# Patient Record
Sex: Male | Born: 1995 | Race: White | Hispanic: No | Marital: Married | Smoking: Never smoker
Health system: Southern US, Community
[De-identification: ages and names within clinical notes are randomized; demographics above are authoritative.]

---
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
## 2015-07-03 ENCOUNTER — Encounter (HOSPITAL_COMMUNITY): Payer: Self-pay | Admitting: Emergency Medicine

## 2015-07-03 ENCOUNTER — Emergency Department (HOSPITAL_COMMUNITY)
Admission: EM | Admit: 2015-07-03 | Discharge: 2015-07-04 | Disposition: A | Discharge status: Other Acute Inpatient Hospital | Payer: Medicaid Other | Attending: Emergency Medicine | Admitting: Emergency Medicine

## 2015-07-03 DIAGNOSIS — G479 Sleep disorder, unspecified: Secondary | ICD-10-CM | POA: Insufficient documentation

## 2015-07-03 DIAGNOSIS — F1210 Cannabis abuse, uncomplicated: Secondary | ICD-10-CM | POA: Insufficient documentation

## 2015-07-03 DIAGNOSIS — F329 Major depressive disorder, single episode, unspecified: Secondary | ICD-10-CM | POA: Insufficient documentation

## 2015-07-03 DIAGNOSIS — F99 Mental disorder, not otherwise specified: Secondary | ICD-10-CM

## 2015-07-03 DIAGNOSIS — R05 Cough: Secondary | ICD-10-CM | POA: Insufficient documentation

## 2015-07-03 LAB — COMPREHENSIVE METABOLIC PANEL
ALBUMIN: 4.3 g/dL (ref 3.5–5.0)
ALT: 13 U/L — ABNORMAL LOW (ref 17–63)
AST: 18 U/L (ref 15–41)
Alkaline Phosphatase: 103 U/L (ref 38–126)
Anion gap: 10 (ref 5–15)
BILIRUBIN TOTAL: 0.9 mg/dL (ref 0.3–1.2)
BUN: 9 mg/dL (ref 6–20)
CALCIUM: 9.9 mg/dL (ref 8.9–10.3)
CO2: 25 mmol/L (ref 22–32)
CREATININE: 0.93 mg/dL (ref 0.61–1.24)
Chloride: 106 mmol/L (ref 101–111)
GFR calc Af Amer: >60 mL/min (ref >60)
GLUCOSE: 109 mg/dL — AB (ref 65–99)
Potassium: 3.5 mmol/L (ref 3.5–5.1)
Sodium: 141 mmol/L (ref 135–145)
TOTAL PROTEIN: 7.7 g/dL (ref 6.5–8.1)

## 2015-07-03 LAB — CBC
HEMATOCRIT: 45.5 % (ref 39.0–52.0)
Hemoglobin: 15.9 g/dL (ref 13.0–17.0)
MCH: 29.2 pg (ref 26.0–34.0)
MCHC: 34.9 g/dL (ref 30.0–36.0)
MCV: 83.5 fL (ref 78.0–100.0)
PLATELETS: 246 10*3/uL (ref 150–400)
RBC: 5.45 MIL/uL (ref 4.22–5.81)
RDW: 12.2 % (ref 11.5–15.5)
WBC: 7.3 10*3/uL (ref 4.0–10.5)

## 2015-07-03 LAB — ACETAMINOPHEN LEVEL: Acetaminophen (Tylenol), Serum: <10 ug/mL — ABNORMAL LOW (ref 10–30)

## 2015-07-03 LAB — RAPID URINE DRUG SCREEN, HOSP PERFORMED
Amphetamines: NOT DETECTED
BARBITURATES: NOT DETECTED
BENZODIAZEPINES: NOT DETECTED
Cocaine: NOT DETECTED
Opiates: NOT DETECTED
Tetrahydrocannabinol: POSITIVE — AB

## 2015-07-03 LAB — SALICYLATE LEVEL: Salicylate Lvl: <4 mg/dL (ref 2.8–30.0)

## 2015-07-03 LAB — ETHANOL

## 2015-07-03 NOTE — ED Notes (Signed)
No answer in waiting room 

## 2015-07-03 NOTE — ED Notes (Signed)
Staffing called for sitter.   

## 2015-07-03 NOTE — ED Notes (Signed)
Pt requested sandwich after telling sitter that he had not eaten for over a day.  He is now verbal and communicating freely.  This EMT provided [place] sandwich, applesauce, milk and ice water.

## 2015-07-03 NOTE — ED Notes (Signed)
Pt alert and talking animatedly with sitter. Significant improvement in mood, speech, and behavior from arrival.

## 2015-07-03 NOTE — ED Notes (Signed)
Family member states pts friend committed sucide  3 days ago and today pt told aunt that he was having thoughts about harming himself. Pt is crying in triage and will not answer any questions other that shaking his head yes or no.

## 2015-07-03 NOTE — ED Provider Notes (Signed)
CSN: [id number]     Arrival date & time 07/03/15  1943 History   First MD Initiated Contact with Patient 07/03/15 2043     Chief Complaint  Patient presents with  . Suicidal     (Consider location/radiation/quality/duration/timing/severity/associated sxs/prior Treatment) HPI Comments: Normally healthy 20 year old male who presents to the emergency department with thoughts of not wanting to be here.  He states it is a wish.  Since his girlfriend committed suicide 3 days ago.  He's feeling incredibly sad, crying.  He states that he used to smoke.  Rare marijuana with his girlfriend, but since she died.  He has not used any drugs or alcohol.  He has no specific plan for harming himself.  The history is provided by the patient.    History reviewed. No pertinent past medical history. History reviewed. No pertinent past surgical history. History reviewed. No pertinent family history. Social History  Substance Use Topics  . Smoking status: Never Smoker   . Smokeless tobacco: None  . Alcohol Use: No    Review of Systems  Constitutional: Negative for fever.  Respiratory: Positive for cough.   Neurological: Negative for headaches.  Psychiatric/Behavioral: Positive for suicidal ideas and sleep disturbance.  All other systems reviewed and are negative.     Allergies  Review of patient's allergies indicates no known allergies.  Home Medications   Prior to Admission medications   Not on File   BP 140/98 mmHg  Pulse 88  Temp(Src) 98.8 F (37.1 C) (Oral)  Resp 16  SpO2 100% Physical Exam  Constitutional: He appears well-developed and well-nourished.  HENT:  Head: Normocephalic.  Eyes: Pupils are equal, round, and reactive to light.  Neck: Normal range of motion.  Cardiovascular: Normal rate.   Pulmonary/Chest: Effort normal.  Musculoskeletal: Normal range of motion.  Neurological: He is alert.  Skin: Skin is warm.  Psychiatric: Judgment normal. His speech is delayed.  Cognition and memory are normal. He exhibits a depressed mood. He expresses suicidal ideation. He expresses no suicidal plans and no homicidal plans.  Patient does not make contact.  He appears very sad and tearful He is inattentive.  Nursing note and vitals reviewed.   ED Course  Procedures (including critical care time) Labs Review Labs Reviewed  COMPREHENSIVE METABOLIC PANEL - Abnormal; Notable for the following:    Glucose, Bld 109 (*)    ALT 13 (*)    All other components within normal limits  ACETAMINOPHEN LEVEL - Abnormal; Notable for the following:    Acetaminophen (Tylenol), Serum <10 (*)    All other components within normal limits  URINE RAPID DRUG SCREEN, HOSP PERFORMED - Abnormal; Notable for the following:    Tetrahydrocannabinol POSITIVE (*)    All other components within normal limits  ETHANOL  SALICYLATE LEVEL  CBC    Imaging Review No results found. I have personally reviewed and evaluated these images and lab results as part of my medical decision-making.   EKG Interpretation None     Patient is medically cleared for psychiatric evaluation This patient has been evaluated by TTS they do not feel that he met criteria for admission and he was much more animated at the time of their discussion, I do not feel comfortable discharging the patient home at this time without having been seen by the psychiatrist.  He will be held over until morning to see psychiatry.  He will then determine better disposition for this patient MDM   Final diagnoses:  None  Junius Creamer, NP 07/03/15 [place], NP 07/04/15 0302  Davonna Belling, MD 07/05/15 [phone]

## 2015-07-04 ENCOUNTER — Observation Stay (HOSPITAL_COMMUNITY)
Admission: AD | Admit: 2015-07-04 | Discharge: 2015-07-05 | Disposition: A | Discharge status: Home / Self Care | Payer: Federal, State, Local not specified - Other | Source: Intra-hospital | Attending: Psychiatry | Admitting: Psychiatry

## 2015-07-04 ENCOUNTER — Encounter (HOSPITAL_COMMUNITY): Payer: Self-pay | Admitting: *Deleted

## 2015-07-04 DIAGNOSIS — R45851 Suicidal ideations: Secondary | ICD-10-CM | POA: Insufficient documentation

## 2015-07-04 DIAGNOSIS — F4321 Adjustment disorder with depressed mood: Secondary | ICD-10-CM | POA: Diagnosis not present

## 2015-07-04 MED ORDER — ACETAMINOPHEN 325 MG PO TABS: 650.0000 mg | ORAL_TABLET | Freq: Four times a day (QID) | ORAL | Status: DC | PRN

## 2015-07-04 MED ORDER — MAGNESIUM HYDROXIDE 400 MG/5ML PO SUSP: 30.0000 mL | Freq: Every day | ORAL | Status: DC | PRN

## 2015-07-04 MED ORDER — HYDROXYZINE HCL 25 MG PO TABS: 25.0000 mg | ORAL_TABLET | Freq: Three times a day (TID) | ORAL | Status: DC | PRN

## 2015-07-04 MED ORDER — DOXEPIN HCL 10 MG PO CAPS
10.0000 mg | ORAL_CAPSULE | Freq: Every day | ORAL | Status: DC
  Administered 2015-07-04: 10 mg via ORAL
  Filled 2015-07-04 (×4): qty 1

## 2015-07-04 MED ORDER — ALUM & MAG HYDROXIDE-SIMETH 200-200-20 MG/5ML PO SUSP: 30.0000 mL | ORAL | Status: DC | PRN

## 2015-07-04 MED ORDER — TRAZODONE HCL 50 MG PO TABS
50.0000 mg | ORAL_TABLET | Freq: Every evening | ORAL | Status: DC | PRN
Start: 2015-07-04 — End: 2015-07-04

## 2015-07-04 NOTE — Plan of Care (Signed)
[facility] Observation Crisis Plan  Reason for Crisis Plan:  Crisis Stabilization   Plan of Care:  Referral for Telepsychiatry/Psychiatric Consult  Family Support:      Current Living Environment:  Living Arrangements: Parent  Insurance:   Hospital Account    Name Acct ID Class Status Primary Coverage   Lindalou HoseFranklin, Jaxsin N [id number] BEHAVIORAL HEALTH OBSERVATION [facility] [place] [address]        Guarantor Account (for Hospital Account [id number])    Name Relation to Pt Service Area Active? Acct Type   Lindalou HoseFranklin, Hesham N Self [facility] Yes [facility]   Address Phone       [address] [place], [place] [address] [phone](O)          Coverage Information (for Hospital Account [id number])    F/O Payor/Plan Precert #   [facility] [address]    Subscriber Subscriber #   Lindalou HoseFranklin, Darrio N [id number]   Address Phone   [address] [place], [place] [address] [phone]      Legal Guardian:     Primary Care Provider:  Leo GrosserPICKARD,WARREN TOM, MD  Current Outpatient Providers:  unknown  Psychiatrist:     Counselor/Therapist:     Compliant with Medications:  Yes  Additional Information:   Loren RacerMaggio, Elisabel Hanover J 1/4/201711:57 AM

## 2015-07-04 NOTE — ED Notes (Signed)
Pt's mother Tommy Robinson, [phone]

## 2015-07-04 NOTE — Progress Notes (Signed)
Patient ID: Tommy Robinson, male   DOB: [birth year], 20 y.o.   MRN: [id number] Patient admitted to obs. After telling aunt he wished he was with his dead GF.  Patient stated his girlfriend of two months hung herself on New Years Day. Patient states he is not suicidal but is sad and depressed and withdrawn.  Patient states he uses marijuana regularly but rarely drinks alcohol.  Denies HI and AVH.  Denies withdrawal symptoms.  Affect sad, depressed and guarded. Patient oriented to unit.

## 2015-07-04 NOTE — ED Notes (Signed)
Visitor at bedside.

## 2015-07-04 NOTE — ED Notes (Addendum)
Pt's father, Loel LoftyJoe Vink [phone]  Pt's grandparents Molly MaduroRobert and Vassie MomentVeronica Granados [phone] - if pt needs a ride  Given permission by pt to update father on pt's disposition

## 2015-07-04 NOTE — Progress Notes (Signed)
[facility] bed [facility]. Rosey BathKelly Gianny Sabino, RN

## 2015-07-04 NOTE — Progress Notes (Signed)
[facility] INPATIENT:  Family/Significant Other Suicide Prevention Education  Suicide Prevention Education:  Patient Refusal for Family/Significant Other Suicide Prevention Education: The patient Lindalou HoseJoshua N Escalera has refused to provide written consent for family/significant other to be provided Family/Significant Other Suicide Prevention Education during admission and/or prior to discharge.  Physician notified.  Loren RacerMaggio, Aviel Davalos J 07/04/2015, 11:57 AM

## 2015-07-04 NOTE — BH Assessment (Addendum)
Tele Assessment Note   Tommy Robinson is an 20 y.o.single male who was brought into the [facility] by his aunt after telling her that he wished he could be with his deceased GF.  Pt sts that his GF of 2 months (friends for 3 yrs.) committed suicide on New Year's Day.  Pt sts that he wishes he could have helped her and wishes he could be with her but, would not actually try to kill himself.  Pt denies HI, SHI and AVH. Pt sts that he has had SI (passive) but has not had thoughts about how he might kill himself. Pt sts it had not crossed his mind. Pt sts that he would not try to kill himself because 1) he is afraid to try, 2) fear of not knowing what is beyond this world and 3) he promised his father that he would not harm himself and he sts he intends on keeping that promise. Pt sts that he had dated his GF before for about 1 month, then they were just friends for a bout 3 years and just recently (about 2 months) begun to talk/date again. Pt sts that he had no idea that she was seriously suicidal.  Pt sts he knew that she "was upset about something." Pt sts that a short time before her suicide, pt was pressing her to let him help her and she pulled away and cut herself off from him.  Pt sts that he wishes he could have helped her but, was shocked when a mutual friend texted him of her death. Pt sts that he is angry with her for killing herself but sts "I know I can move on from this." Per record, when pt arrived at the ED he was crying and would not speak except to nod his head yes and no in response to questions.  Pt sts that he gets emotional at times but, simply is grieving her death, not thinking about self-harm. Pt sts he smokes marijuana usually only on weekends but, before he lived with his father, pr sts he smoked marijuana everyday.  Pt sts he drinks alcohol only rarely and usually with family at holidays only. Pt tested BAL <5 and UDS was + for THC but otherwise, negative. Pt sts he sleeps and eats well,  although he often does not go to sleep until 2 am. Symptoms of depression include deep sadness, fatigue, excessive guilt, decreased self esteem, tearfulness & crying spells, self isolation, lack of motivation for activities and pleasure, irritability, negative outlook, difficulty thinking & concentrating. Pt sts he does not feel helpless or hopeless, and sts he has no sleep and eating disturbances.  Pt sts he lives with his father and has been living with him for about 6 months.  Pt sts that he is waiting to live with his brother once he gets a place big enough but he sts he has been waiting for a long time now.  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  Pt sts he stopped school in the 10th grade and has not completed a GED. Pt sts he does not have a psychiatrist or a therapist and sts he never has.  Pt sts he has never been IP for MH reasons. 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 Pt sts there are no current or past charges against him.  Pt denied any hx of abuse.   Pt was dressed in scrubs and sitting on his hospital bed. Pt was alert, cooperative and pleasant. Pt kept good eye contact, spoke in a clear tone and at normal pace. Pt moved in a normal manner when moving. Pt's thought process was coherent and relevant and judgement did not seem impaired.  Pt's mood was depressed and his blunted affect was congruent.  Pt was oriented x 4, to person, place, time and situation.   Diagnosis: 311 Unspecified Depressive Disorder  Past Medical History: History reviewed. No pertinent past medical history.  History reviewed. No pertinent past surgical history.  Family History: History reviewed. No pertinent family history.  Social History:  reports that he has never smoked. He does not have any smokeless tobacco history on  file. He reports that he does not drink alcohol. His drug history is not on file.  Additional Social History:  Alcohol / Drug Use Prescriptions: See PTA list History of alcohol / drug use?: Yes Substance #1 Name of Substance 1: Alcohol 1 - Age of First Use: teens 1 - Amount (size/oz): unknown 1 - Frequency: rarely, usually a family occasion 1 - Duration: ongoing 1 - Last Use / Amount: Christmas Substance #2 Name of Substance 2: Marijuana 2 - Age of First Use: 53 -76 yo 2 - Amount (size/oz): unknown 2 - Frequency: last 6 months living w Dad- on weekends; prior to living w dad-daily 2 - Duration: ongoing 2 - Last Use / Amount: last weekend  CIWA: CIWA-Ar BP: 140/98 mmHg Pulse Rate: 88 COWS:    PATIENT STRENGTHS: (choose at least two) Average or above average intelligence Communication skills Supportive family/friends  Allergies: No Known Allergies  Home Medications:  (Not in a hospital admission)  OB/GYN Status:  No LMP for male patient.  General Assessment Data Location of Assessment: [facility] ED TTS Assessment: In system Is this a Tele or Face-to-Face Assessment?: Tele Assessment Is this an Initial Assessment or a Re-assessment for this encounter?: Initial Assessment Marital status: Single Maiden name: na Is patient pregnant?: No Pregnancy Status: No Living Arrangements: Parent (lives with dad) Can pt return to current living arrangement?: Yes Admission Status: Voluntary Is patient capable of signing voluntary admission?: Yes Referral Source: Self/Family/Friend Insurance type: none  Medical Screening Exam [facility] Walk-in ONLY) Medical Exam completed: Yes  Crisis Care Plan Living Arrangements: Parent (lives with dad) Name of Psychiatrist: none Name of Therapist: none  Education Status Is patient currently in school?: No Current Grade: na Highest grade of school patient has completed: 10 Name of school: na Contact person: na  Risk to self with the past 6  months Suicidal Ideation: Yes-Currently Present (denies SI- sts passive SI of wanted to be w deceased GF) Has patient been a risk to self within the past 6 months prior to admission? : No Suicidal Intent: No (denies) Has patient had any suicidal intent within the past 6 months prior to admission? : No Is patient at risk for suicide?: No (sts he would never actually try due to fear) Suicidal Plan?: No (denies) Has patient had any suicidal plan within the past 6 months prior to admission? : No Access to Means: No (denies) What has been your use of drugs/alcohol within the last 12 months?: weekly use Previous Attempts/Gestures: No How many times?: 0 Other Self Harm Risks: none noted Triggers for Past Attempts:  (na) Intentional  Self Injurious Behavior: None Family Suicide History: No Recent stressful life event(s): Loss (Comment) (Suicide of GF of 2 months (friend for 3 yrs)) Persecutory voices/beliefs?: No Depression: Yes Depression Symptoms: Tearfulness, Isolating, Fatigue, Loss of interest in usual pleasures, Feeling angry/irritable Substance abuse history and/or treatment for substance abuse?: Yes Suicide prevention information given to non-admitted patients: Not applicable  Risk to Others within the past 6 months Homicidal Ideation: No (denies) Does patient have any lifetime risk of violence toward others beyond the six months prior to admission? : No Thoughts of Harm to Others: No (denies) Current Homicidal Intent: No (denies) Current Homicidal Plan: No (denies) Access to Homicidal Means: No (denies) Identified Victim: none History of harm to others?: No (denies) Assessment of Violence: None Noted Violent Behavior Description: na Does patient have access to weapons?: No Criminal Charges Pending?: No (denies) Does patient have a court date: No Is patient on probation?: No  Psychosis Hallucinations: None noted (denies) Delusions: None noted  Mental Status  Report Appearance/Hygiene: In scrubs, Unremarkable Eye Contact: Good Motor Activity: Freedom of movement, Gestures, Unremarkable Speech: Logical/coherent, Unremarkable Level of Consciousness: Quiet/awake Mood: Depressed, Pleasant Affect: Blunted, Depressed Anxiety Level: Minimal Thought Processes: Coherent, Relevant, Tangential (thoughts of grief over GF's death) Judgement: Partial Orientation: Person, Place, Time, Situation Obsessive Compulsive Thoughts/Behaviors: None  Cognitive Functioning Concentration: Good Memory: Recent Intact, Remote Intact IQ: Average Insight: Fair Impulse Control: Fair Appetite: Fair Weight Loss: 0 Weight Gain: 0 Sleep: No Change Total Hours of Sleep: 12 (sts is normal amt of sleep for him) Vegetative Symptoms: None  ADLScreening [facility] Assessment Services) Patient's cognitive ability adequate to safely complete daily activities?: Yes Patient able to express need for assistance with ADLs?: Yes Independently performs ADLs?: Yes (appropriate for developmental age)  Prior Inpatient Therapy Prior Inpatient Therapy: No Prior Therapy Dates: na Prior Therapy Facilty/Provider(s): na Reason for Treatment: na  Prior Outpatient Therapy Prior Outpatient Therapy: No Prior Therapy Dates: na Prior Therapy Facilty/Provider(s): na Reason for Treatment: na Does patient have an ACCT team?: No Does patient have Intensive In-House Services?  : No Does patient have [facility] services? : No Does patient have P4CC services?: No  ADL Screening (condition at time of admission) Patient's cognitive ability adequate to safely complete daily activities?: Yes Patient able to express need for assistance with ADLs?: Yes Independently performs ADLs?: Yes (appropriate for developmental age)       Abuse/Neglect Assessment (Assessment to be complete while patient is alone) Physical Abuse: Denies Verbal Abuse: Denies Sexual Abuse: Denies Exploitation of patient/patient's  resources: Denies Self-Neglect: Denies     Merchant navy officer (For Healthcare) Does patient have an advance directive?: No Would patient like information on creating an advanced directive?: No - patient declined information    Additional Information 1:1 In Past 12 Months?: No CIRT Risk: No Elopement Risk: No Does patient have medical clearance?: Yes     Disposition:  Disposition Initial Assessment Completed for this Encounter: Yes Disposition of Patient: Other dispositions (Pending review w [facility] Extender or MD) Other disposition(s): Other (Comment)  Per Donell Sievert, PA: Does not meet IP criteria.  Recommend discharge with list of OP resources for immediate follow-up tx.   Spoke with Earley Favor, NP: She disagrees based on her earlier contact with the pt who was exhibiting a much deeper depression at that time.  NP states that she wants pt to be observed tonight and to be seen by psychiatry in the morning.     Beryle Flock, MS, CRC, [facility]  Triage Specialist [facility] Fatima Fedie T 07/04/2015 2:13 AM

## 2015-07-04 NOTE — Progress Notes (Signed)
Tommy NorfolkJoshua Peggs Shift note: Pt. Noted in bed.  Introduced my self to pt. No complaints or concerns voiced. No distress or abnormal behavior noted. Will continue to monitor for safety. Pt is continuously observed, except during bathroom usage.

## 2015-07-05 DIAGNOSIS — F4320 Adjustment disorder, unspecified: Secondary | ICD-10-CM

## 2015-07-05 DIAGNOSIS — F4321 Adjustment disorder with depressed mood: Secondary | ICD-10-CM | POA: Diagnosis not present

## 2015-07-05 NOTE — Discharge Instructions (Signed)
You are encouraged to follow up with outpatient resources for Grief Counseling and Support Services. A list of resources with contact information has been provide to you.

## 2015-07-05 NOTE — H&P (Signed)
Psychiatric Admission Assessment Adult  Patient Identification: Tommy Robinson MRN:  [id number] Date of Evaluation:  07/05/2015 Chief Complaint:  Passive SI Principal Diagnosis: Adjustment disorder with depression Patient Active Problem List   Diagnosis Date Noted  . Adjustment disorder with depressed mood [F43.21]   . Suicidal ideation [R45.851] 07/04/2015   History of Present Illness: Tommy Robinson is a 20 y/o WM presenting initially to the [facility] and accompanied with his aunt due to emotional and mood volatility over the past several days. The patient has been tear full, sad and having feelings of hopelessness and guilt due to the passing of his long term friend of three years and GF of 2 months who unfortunately committed suicide to completion on New Years day. Tommy Robinson is denying any SI or HI at this time. He is denying any historyof depression, self mutilation or prior SA. Associated Signs/Symptoms: Depression Symptoms:  depressed mood, hopelessness, (Hypo) Manic Symptoms:  n/a Anxiety Symptoms:  Excessive Worry, Psychotic Symptoms:  n/a PTSD Symptoms: Negative Total Time spent with patient: 30 minutes  Past Psychiatric History: N/A  Risk to Self: Is patient at risk for suicide?: No Risk to Others:   Prior Inpatient Therapy:   Prior Outpatient Therapy:    Alcohol Screening: 1. How often do you have a drink containing alcohol?: Monthly or less 2. How many drinks containing alcohol do you have on a typical day when you are drinking?: 3 or 4 3. How often do you have six or more drinks on one occasion?: Never Preliminary Score: 1 Brief Intervention: Patient declined brief intervention Substance Abuse History in the last 12 months:  Yes.   Consequences of Substance Abuse: Negative Previous Psychotropic Medications: No  Psychological Evaluations: No  Past Medical History: History reviewed. No pertinent past medical history. History reviewed. No pertinent past surgical  history. Family History: History reviewed. No pertinent family history. Family Psychiatric  History: denies Social History:  History  Alcohol Use No     History  Drug Use Not on file    Social History   Social History  . Marital Status: Married    Spouse Name: N/A  . Number of Children: N/A  . Years of Education: N/A   Social History Main Topics  . Smoking status: Never Smoker   . Smokeless tobacco: None  . Alcohol Use: No  . Drug Use: None  . Sexual Activity: Not Asked   Other Topics Concern  . None   Social History Narrative   Additional Social History:    Prescriptions: See PTA list History of alcohol / drug use?: Yes Name of Substance 1: Alcohol 1 - Age of First Use: teens 1 - Amount (size/oz): unknown 1 - Frequency: rarely, usually a family occasion 1 - Duration: ongoing 1 - Last Use / Amount: Christmas Name of Substance 2: Marijuana 2 - Age of First Use: 2 -3 yo 2 - Amount (size/oz): unknown 2 - Frequency: last 6 months living w Dad- on weekends; prior to living w dad-daily 2 - Duration: ongoing 2 - Last Use / Amount: last weekend                Allergies:  No Known Allergies Lab Results:  Results for orders placed or performed during the hospital encounter of 07/03/15 (from the past 48 hour(s))  Urine rapid drug screen (hosp performed) (Not at [facility])     Status: Abnormal   Collection Time: 07/03/15  8:04 PM  Result Value Ref Range  Opiates NONE DETECTED NONE DETECTED   Cocaine NONE DETECTED NONE DETECTED   Benzodiazepines NONE DETECTED NONE DETECTED   Amphetamines NONE DETECTED NONE DETECTED   Tetrahydrocannabinol POSITIVE (A) NONE DETECTED   Barbiturates NONE DETECTED NONE DETECTED    Comment:        DRUG SCREEN FOR MEDICAL PURPOSES ONLY.  IF CONFIRMATION IS NEEDED FOR ANY PURPOSE, NOTIFY LAB WITHIN 5 DAYS.        LOWEST DETECTABLE LIMITS FOR URINE DRUG SCREEN Drug Class       Cutoff (ng/mL) Amphetamine      1000 Barbiturate       200 Benzodiazepine   007 Tricyclics       121 Opiates          300 Cocaine          300 THC              50   Comprehensive metabolic panel     Status: Abnormal   Collection Time: 07/03/15  8:42 PM  Result Value Ref Range   Sodium 141 135 - 145 mmol/L   Potassium 3.5 3.5 - 5.1 mmol/L   Chloride 106 101 - 111 mmol/L   CO2 25 22 - 32 mmol/L   Glucose, Bld 109 (H) 65 - 99 mg/dL   BUN 9 6 - 20 mg/dL   Creatinine, Ser 0.93 0.61 - 1.24 mg/dL   Calcium 9.9 8.9 - 10.3 mg/dL   Total Protein 7.7 6.5 - 8.1 g/dL   Albumin 4.3 3.5 - 5.0 g/dL   AST 18 15 - 41 U/L   ALT 13 (L) 17 - 63 U/L   Alkaline Phosphatase 103 38 - 126 U/L   Total Bilirubin 0.9 0.3 - 1.2 mg/dL   GFR calc non Af Amer >60 >60 mL/min   GFR calc Af Amer >60 >60 mL/min    Comment: (NOTE) The eGFR has been calculated using the CKD EPI equation. This calculation has not been validated in all clinical situations. eGFR's persistently <60 mL/min signify possible Chronic Kidney Disease.    Anion gap 10 5 - 15  Ethanol (ETOH)     Status: None   Collection Time: 07/03/15  8:42 PM  Result Value Ref Range   Alcohol, Ethyl (B) <5 <5 mg/dL    Comment:        LOWEST DETECTABLE LIMIT FOR SERUM ALCOHOL IS 5 mg/dL FOR MEDICAL PURPOSES ONLY   Salicylate level     Status: None   Collection Time: 07/03/15  8:42 PM  Result Value Ref Range   Salicylate Lvl <9.7 2.8 - 30.0 mg/dL  Acetaminophen level     Status: Abnormal   Collection Time: 07/03/15  8:42 PM  Result Value Ref Range   Acetaminophen (Tylenol), Serum <10 (L) 10 - 30 ug/mL    Comment:        THERAPEUTIC CONCENTRATIONS VARY SIGNIFICANTLY. A RANGE OF 10-30 ug/mL MAY BE AN EFFECTIVE CONCENTRATION FOR MANY PATIENTS. HOWEVER, SOME ARE BEST TREATED AT CONCENTRATIONS OUTSIDE THIS RANGE. ACETAMINOPHEN CONCENTRATIONS >150 ug/mL AT 4 HOURS AFTER INGESTION AND >50 ug/mL AT 12 HOURS AFTER INGESTION ARE OFTEN ASSOCIATED WITH TOXIC REACTIONS.   CBC     Status: None    Collection Time: 07/03/15  8:42 PM  Result Value Ref Range   WBC 7.3 4.0 - 10.5 K/uL   RBC 5.45 4.22 - 5.81 MIL/uL   Hemoglobin 15.9 13.0 - 17.0 g/dL   HCT 45.5 39.0 - 52.0 %  MCV 83.5 78.0 - 100.0 fL   MCH 29.2 26.0 - 34.0 pg   MCHC 34.9 30.0 - 36.0 g/dL   RDW 12.2 11.5 - 15.5 %   Platelets 246 150 - 390 K/uL    Metabolic Disorder Labs:  No results found for: HGBA1C, MPG No results found for: PROLACTIN No results found for: CHOL, TRIG, HDL, CHOLHDL, VLDL, LDLCALC  Current Medications: Current Facility-Administered Medications  Medication Dose Route Frequency Provider Last Rate Last Dose  . acetaminophen (TYLENOL) tablet 650 mg  650 mg Oral Q6H PRN Kerrie Buffalo, NP      . alum & mag hydroxide-simeth (MAALOX/MYLANTA) 200-200-20 MG/5ML suspension 30 mL  30 mL Oral Q4H PRN Kerrie Buffalo, NP      . doxepin (SINEQUAN) capsule 10 mg  10 mg Oral QHS Laverle Hobby, PA-C   10 mg at 07/04/15 2319  . hydrOXYzine (ATARAX/VISTARIL) tablet 25 mg  25 mg Oral TID PRN Kerrie Buffalo, NP      . magnesium hydroxide (MILK OF MAGNESIA) suspension 30 mL  30 mL Oral Daily PRN Kerrie Buffalo, NP       PTA Medications: No prescriptions prior to admission    Musculoskeletal: Strength & Muscle Tone: within normal limits Gait & Station: normal Patient leans: N/A  Psychiatric Specialty Exam: Physical Exam  Nursing note and vitals reviewed. Constitutional: He is oriented to person, place, and time. He appears well-developed and well-nourished.  HENT:  Head: Normocephalic.  Eyes: Pupils are equal, round, and reactive to light.  Respiratory: Effort normal and breath sounds normal.  Neurological: He is alert and oriented to person, place, and time. No cranial nerve deficit.  Skin: Skin is warm and dry.  Psychiatric:  See psych evaluation    Review of Systems  Psychiatric/Behavioral: Positive for depression. Negative for suicidal ideas, hallucinations and substance abuse. The patient has  insomnia. The patient is not nervous/anxious.   All other systems reviewed and are negative.   Blood pressure 118/70, pulse 90, temperature 98.2 F (36.8 C), temperature source Oral, resp. rate 20, SpO2 100 %.There is no height or weight on file to calculate BMI.  General Appearance: Casual and Disheveled  Eye Contact::  Good  Speech:  Clear and Coherent  Volume:  Normal  Mood:  Dysphoric  Affect:  Congruent  Thought Process:  Circumstantial  Orientation:  Full (Time, Place, and Person)  Thought Content:  Negative  Suicidal Thoughts:  No  Homicidal Thoughts:  No  Memory:  Immediate;   Good  Judgement:  Fair  Insight:  Fair  Psychomotor Activity:  Negative  Concentration:  Good  Recall:  Good  Fund of Knowledge:Fair  Language: Good  Akathisia:  Negative  Handed:  Right  AIMS (if indicated):     Assets:  Communication Skills  ADL's:  Intact  Cognition: WNL  Sleep:        Treatment Plan Summary: Plan Admitted to [facility] Observation for crises intervention,safety and stabilization, Further disposition plans per TTS in am  Observation Level/Precautions:  Continuous Observation  Laboratory:    Psychotherapy:    Medications:    Consultations:    Discharge Concerns:    Estimated LOS: 24 - 48 hours  Other:     I certify that inpatient services furnished can reasonably be expected to improve the patient's condition.   Lashanna Angelo E 1/5/20175:20 AM

## 2015-07-05 NOTE — Discharge Summary (Signed)
OBS Discharge Summary Note  Patient:  Tommy Robinson is an 20 y.o., male MRN:  [id number] DOB:  May 24, 1996 Patient phone:  [phone] (home)  Patient address:   [address] [place] [address],  Total Time spent with patient: 45 minutes  Date of Admission:  07/04/2015 Date of Discharge: 07/05/2015  Reason for Admission:  Depression, grief  Principal Problem: <principal problem not specified> Discharge Diagnoses: Patient Active Problem List   Diagnosis Date Noted  . Adjustment disorder with depressed mood [F43.21]   . Suicidal ideation [R45.851] 07/04/2015    Past Psychiatric History:  See above  Past Medical History: History reviewed. No pertinent past medical history. History reviewed. No pertinent past surgical history. Family History: History reviewed. No pertinent family history. Family Psychiatric  History:  Denies Social History:  History  Alcohol Use No     History  Drug Use Not on file    Social History   Social History  . Marital Status: Married    Spouse Name: N/A  . Number of Children: N/A  . Years of Education: N/A   Social History Main Topics  . Smoking status: Never Smoker   . Smokeless tobacco: None  . Alcohol Use: No  . Drug Use: None  . Sexual Activity: Not Asked   Other Topics Concern  . None   Social History Narrative    Hospital Course:  Tommy Robinson was admitted for Grief reaction and crisis management.  He was treated with the following medications as listed below.  Tommy Robinson was discharged with current medication and was instructed on how to take medications as prescribed; (details listed below under Medication List).  Medical problems were identified and treated as needed.  Home medications were restarted as appropriate.  Improvement was monitored by observation and Tommy Robinson daily report of symptom reduction.  Emotional and mental status was monitored by clinical staff.         Tommy Robinson was  evaluated by the treatment team for stability and plans for continued recovery upon discharge.  Tommy Robinson motivation was an integral factor for scheduling further treatment.  Employment, transportation, bed availability, health status, family support, and any pending legal issues were also considered during his hospital stay.  He was offered further treatment options upon discharge including but not limited to Residential, Intensive Outpatient, and Outpatient treatment.  Tommy Robinson will follow up with the services as listed below under Follow Up Information.     Upon completion of this admission the Tommy Robinson was both mentally and medically stable for discharge denying suicidal/homicidal ideation, auditory/visual/tactile hallucinations, delusional thoughts and paranoia.     Physical Findings: AIMS: Facial and Oral Movements Muscles of Facial Expression: None, normal Lips and Perioral Area: None, normal Jaw: None, normal Tongue: None, normal,Extremity Movements Upper (arms, wrists, hands, fingers): None, normal Lower (legs, knees, ankles, toes): None, normal, Trunk Movements Neck, shoulders, hips: None, normal, Overall Severity Severity of abnormal movements (highest score from questions above): None, normal Incapacitation due to abnormal movements: None, normal Patient's awareness of abnormal movements (rate only patient's report): No Awareness, Dental Status Current problems with teeth and/or dentures?: No Does patient usually wear dentures?: No  CIWA:    COWS:     Musculoskeletal: Strength & Muscle Tone: within normal limits Gait & Station: normal Patient leans: N/A  Psychiatric Specialty Exam: Review of Systems  All other systems reviewed and are negative.   Blood pressure 118/70, pulse  90, temperature 98.2 F (36.8 C), temperature source Oral, resp. rate 20, SpO2 100 %.There is no height or weight on file to calculate BMI.   General Appearance: Fairly  Groomed  Patent attorneyye Contact:: Fair  Speech: Clear and Coherent  Volume: Normal  Mood: Anxious and Depressed  Affect: anxious worried  Thought Process: Coherent and Goal Directed  Orientation: Full (Time, Place, and Person)  Thought Content: symptoms events worries concerns  Suicidal Thoughts: No  Homicidal Thoughts: No  Memory: Immediate; Fair Recent; Fair Remote; Fair  Judgement: Fair  Insight: Present and Shallow  Psychomotor Activity: Restlessness  Concentration: Fair  Recall: Fis[employer]Fair  Fund of Knowledge:Fair  Language: Fair  Akathisia: No  Handed: Right  AIMS (if indicated):   Assets: Communication Skills Desire for Improvement Housing Intimacy Leisure Time Resilience Social Support  ADL's: Intact  Cognition: WNL  Sleep: fair       Have you used any form of tobacco in the last 30 days? (Cigarettes, Smokeless Tobacco, Cigars, and/or Pipes): No  Has this patient used any form of tobacco in the last 30 days? (Cigarettes, Smokeless Tobacco, Cigars, and/or Pipes) Yes, N/A  Metabolic Disorder Labs:  No results found for: HGBA1C, MPG No results found for: PROLACTIN No results found for: CHOL, TRIG, HDL, CHOLHDL, VLDL, LDLCALC  Discharge destination:  Home  Is patient on multiple antipsychotic therapies at discharge:  No   Has Patient had three or more failed trials of antipsychotic monotherapy by history:  No  Recommended Plan for Multiple Antipsychotic Therapies: NA     Medication List    Notice    You have not been prescribed any medications.         Follow-up Information    Follow up with [facility] [place]. Call today.   Why:  for follow up and continuation of care   Contact information:   [address] [place], [place] [address] [phone]      Follow-up recommendations:  Activity:  as tol Diet:  as  tol  Comments:  1.  Take all your medications as prescribed.              2.  Report any adverse side effects to outpatient provider.                       3.  Patient instructed to not use alcohol or illegal drugs while on prescription medicines.            4.  In the event of worsening symptoms, instructed patient to call 911, the crisis hotline or go to nearest emergency room for evaluation of symptoms.  Signed: Velna HatchetSheila May Reinhart Saulters AGNP-BC 07/05/2015, 6:20 PM

## 2015-07-05 NOTE — Progress Notes (Signed)
Patient ID: Tommy Robinson, male   DOB: 27-Jul-1995, 20 y.o.   MRN: [id number] Patient discharged per MD orders. Patient given education regarding follow-up appointments and medications. Patient denies any questions or concerns about these instructions. Patient was escorted to locker and given belongings before discharge to hospital lobby. Patient currently denies SI/HI and auditory and visual hallucinations on discharge.

## 2015-07-05 NOTE — BHH Counselor (Signed)
Pt currently denies SI/HI but was having feelings of hopelessness and guilt due to the passing of his long term friend of three years and GF of 2 months who unfortunately committed suicide to completion on New Years day. Mr. Tommy Robinson is denying any SI or HI at this time. He is denying any history of depression, self mutilation or prior SA. Pt would benefit from receiving OPT services once discharged from OBS unit. Pt will be seen in the A.M. by extender to determine disposition.   Ardelle ParkLatoya McNeil, MA OBS Unit

## 2015-07-05 NOTE — Progress Notes (Signed)
   07/05/15 1200  Clinical Encounter Type  Visited With Patient  Visit Type Behavioral Health;Social support;Psychological support;Spiritual support;Initial;Other (Comment) (Observation Unit.)  Referral From Other (Comment) (counselor )  Consult/Referral To Nurse;Other (Comment) (counselor)  Recommendations Pt would benefit from follow up around grief and loss  Spiritual Encounters  Spiritual Needs Grief support  Stress Factors  Patient Stress Factors Loss;Lack of knowledge    Provided brief support around loss of girlfriend at bedside [name].    Tommy Robinson presented alert and oriented with flat affect.  Presented as somewhat distant or evasive, primarily focused on going home.  Stated he had been trying not to think about the death of his girlfriend.  Reported they had been friends for 3 years, dating for a short period.  Described losing a friendship as he began to date her, as she had been dating a good friend of his previously.    Prior to admission, he had been doing things "to distract myself."  He was seeing a movie with friends and trying not to cry, as he did not want his friends to see him crying.   Tommy Robinson stated his aunt [name].    Chaplain provided empathic presence, verbalized and normalized grief process and provided education as appropriate.  Inquired about [name]'s plan for follow up.  He stated he planned to go home, but did not know afterward.    Tommy Robinson reported having been on anti-depressants previously and wondered if this would be helpful for him again.  He did not know physician who had previously described anti-depressant.    Belva CromeStalnaker, Aerabella Galasso Wayne MDiv

## 2024-03-04 ENCOUNTER — Ambulatory Visit: Payer: Self-pay
